# Patient Record
Sex: Female | Born: 1985 | Race: Black or African American | Hispanic: No | Marital: Single | Smoking: Current some day smoker
Health system: Southern US, Community
[De-identification: ages and names within clinical notes are randomized; demographics above are authoritative.]

## PROBLEM LIST (undated history)

## (undated) ENCOUNTER — Emergency Department (HOSPITAL_COMMUNITY): Admission: EM | Payer: Medicaid Other | Source: Home / Self Care

## (undated) DIAGNOSIS — J40 Bronchitis, not specified as acute or chronic: Secondary | ICD-10-CM

## (undated) DIAGNOSIS — F419 Anxiety disorder, unspecified: Secondary | ICD-10-CM

## (undated) DIAGNOSIS — D649 Anemia, unspecified: Secondary | ICD-10-CM

## (undated) HISTORY — PX: NO PAST SURGERIES: SHX2092

---

## 2007-07-31 ENCOUNTER — Emergency Department (HOSPITAL_COMMUNITY): Admission: EM | Admit: 2007-07-31 | Discharge: 2007-07-31 | Payer: Self-pay | Admitting: Emergency Medicine

## 2009-03-26 ENCOUNTER — Inpatient Hospital Stay (HOSPITAL_COMMUNITY): Admission: AD | Admit: 2009-03-26 | Discharge: 2009-03-28 | Payer: Self-pay | Admitting: Obstetrics and Gynecology

## 2010-09-24 LAB — CBC
HCT: 28.6 % — ABNORMAL LOW (ref 36.0–46.0)
HCT: 37.3 % (ref 36.0–46.0)
Hemoglobin: 12.1 g/dL (ref 12.0–15.0)
MCHC: 32.5 g/dL (ref 30.0–36.0)
MCHC: 33.5 g/dL (ref 30.0–36.0)
MCV: 82.5 fL (ref 78.0–100.0)
MCV: 82.7 fL (ref 78.0–100.0)
Platelets: 119 10*3/uL — ABNORMAL LOW (ref 150–400)
RBC: 4.52 MIL/uL (ref 3.87–5.11)
RDW: 14.6 % (ref 11.5–15.5)

## 2010-09-24 LAB — RPR: RPR Ser Ql: NONREACTIVE

## 2012-05-02 ENCOUNTER — Encounter (HOSPITAL_COMMUNITY): Payer: Self-pay | Admitting: *Deleted

## 2012-05-02 ENCOUNTER — Inpatient Hospital Stay (HOSPITAL_COMMUNITY)
Admission: AD | Admit: 2012-05-02 | Discharge: 2012-05-02 | Disposition: A | Discharge status: Home / Self Care | Payer: Medicaid Other | Source: Ambulatory Visit | Attending: Obstetrics & Gynecology | Admitting: Obstetrics & Gynecology

## 2012-05-02 ENCOUNTER — Inpatient Hospital Stay (HOSPITAL_COMMUNITY): Payer: Medicaid Other

## 2012-05-02 DIAGNOSIS — B349 Viral infection, unspecified: Secondary | ICD-10-CM

## 2012-05-02 DIAGNOSIS — B9789 Other viral agents as the cause of diseases classified elsewhere: Secondary | ICD-10-CM

## 2012-05-02 DIAGNOSIS — Z3490 Encounter for supervision of normal pregnancy, unspecified, unspecified trimester: Secondary | ICD-10-CM

## 2012-05-02 DIAGNOSIS — R109 Unspecified abdominal pain: Secondary | ICD-10-CM | POA: Insufficient documentation

## 2012-05-02 DIAGNOSIS — Z3480 Encounter for supervision of other normal pregnancy, unspecified trimester: Secondary | ICD-10-CM

## 2012-05-02 DIAGNOSIS — M79609 Pain in unspecified limb: Secondary | ICD-10-CM | POA: Insufficient documentation

## 2012-05-02 HISTORY — DX: Bronchitis, not specified as acute or chronic: J40

## 2012-05-02 HISTORY — DX: Anemia, unspecified: D64.9

## 2012-05-02 LAB — URINALYSIS, ROUTINE W REFLEX MICROSCOPIC
Glucose, UA: NEGATIVE mg/dL
Hgb urine dipstick: NEGATIVE
Ketones, ur: NEGATIVE mg/dL
Protein, ur: NEGATIVE mg/dL

## 2012-05-02 LAB — COMPREHENSIVE METABOLIC PANEL WITH GFR
ALT: 6 U/L (ref 0–35)
AST: 13 U/L (ref 0–37)
Albumin: 2.9 g/dL — ABNORMAL LOW (ref 3.5–5.2)
Alkaline Phosphatase: 108 U/L (ref 39–117)
BUN: 5 mg/dL — ABNORMAL LOW (ref 6–23)
CO2: 21 meq/L (ref 19–32)
Calcium: 8.6 mg/dL (ref 8.4–10.5)
Chloride: 97 meq/L (ref 96–112)
Creatinine, Ser: 0.52 mg/dL (ref 0.50–1.10)
GFR calc Af Amer: >90 mL/min (ref >90)
GFR calc non Af Amer: >90 mL/min (ref >90)
Glucose, Bld: 76 mg/dL (ref 70–99)
Potassium: 3.4 meq/L — ABNORMAL LOW (ref 3.5–5.1)
Sodium: 130 meq/L — ABNORMAL LOW (ref 135–145)
Total Bilirubin: 0.3 mg/dL (ref 0.3–1.2)
Total Protein: 6.9 g/dL (ref 6.0–8.3)

## 2012-05-02 LAB — URINE MICROSCOPIC-ADD ON

## 2012-05-02 LAB — CBC WITH DIFFERENTIAL/PLATELET
Basophils Absolute: 0 K/uL (ref 0.0–0.1)
Basophils Relative: 0 % (ref 0–1)
Eosinophils Absolute: 0.1 K/uL (ref 0.0–0.7)
Eosinophils Relative: 1 % (ref 0–5)
HCT: 32.7 % — ABNORMAL LOW (ref 36.0–46.0)
Hemoglobin: 10.5 g/dL — ABNORMAL LOW (ref 12.0–15.0)
Lymphocytes Relative: 7 % — ABNORMAL LOW (ref 12–46)
Lymphs Abs: 0.8 K/uL (ref 0.7–4.0)
MCH: 26.4 pg (ref 26.0–34.0)
MCHC: 32.1 g/dL (ref 30.0–36.0)
MCV: 82.2 fL (ref 78.0–100.0)
Monocytes Absolute: 0.9 K/uL (ref 0.1–1.0)
Monocytes Relative: 8 % (ref 3–12)
Neutro Abs: 8.6 K/uL — ABNORMAL HIGH (ref 1.7–7.7)
Neutrophils Relative %: 84 % — ABNORMAL HIGH (ref 43–77)
Platelets: 153 K/uL (ref 150–400)
RBC: 3.98 MIL/uL (ref 3.87–5.11)
RDW: 14.2 % (ref 11.5–15.5)
WBC: 10.2 K/uL (ref 4.0–10.5)

## 2012-05-02 MED ORDER — ACETAMINOPHEN 500 MG PO TABS
1000.0000 mg | ORAL_TABLET | Freq: Once | ORAL | Status: AC
  Administered 2012-05-02: 1000 mg via ORAL
  Filled 2012-05-02: qty 2

## 2012-05-02 MED ORDER — ACETAMINOPHEN 500 MG PO TABS: 1000.0000 mg | ORAL_TABLET | Freq: Four times a day (QID) | ORAL | Status: DC | PRN

## 2012-05-02 MED ORDER — LACTATED RINGERS IV BOLUS (SEPSIS)
1000.0000 mL | Freq: Once | INTRAVENOUS | Status: AC
  Administered 2012-05-02: 1000 mL via INTRAVENOUS

## 2012-05-02 NOTE — MAU Provider Note (Signed)
History     CSN: [id number]  Arrival date and time: 05/02/12 1258   First Provider Initiated Contact with Patient 05/02/12 1355      Chief Complaint  Patient presents with  . Fatigue  . Back Pain  . Leg Pain  . Abdominal Cramping   HPI  Patient is a G3P1011 @ [redacted]w[redacted]d, presenting to MAU for dizziness, fatigue, and shivering.  She states that she woke up yesterday morning and felt dizzy, cold, and tired.  Symptoms continued and gradually worsened as the day progressed, but then resolved somewhat by the evening.  When she awoke this morning, she attempted to go to work, but symptoms worsened again, prompting her to seek care.  Patient reports no recent illnesses or sick contacts.  She states that she has been taking PNV and iron supplement for anemia diagnosed at her OB office.  Patient states that she receives prenatal care in [place].  Past Medical History  Diagnosis Date  . Bronchitis   . Anemia     Past Surgical History  Procedure Date  . No past surgeries     Family History  Problem Relation Age of Onset  . Cancer Mother   . Cancer Maternal Aunt   . Diabetes Maternal Aunt   . Cancer Maternal Grandfather     History  Substance Use Topics  . Smoking status: Current Some Day Smoker  . Smokeless tobacco: Not on file  . Alcohol Use: No    Allergies: No Known Allergies  Prescriptions prior to admission  Medication Sig Dispense Refill  . ferrous sulfate 325 (65 FE) MG tablet Take 325 mg by mouth daily with breakfast.      . Prenatal Vit-Fe Fumarate-FA (PRENATAL MULTIVITAMIN) TABS Take 1 tablet by mouth daily.        Review of Systems  Constitutional: Positive for chills and malaise/fatigue. Negative for fever.  Gastrointestinal: Negative for nausea, vomiting and abdominal pain.  Musculoskeletal: Positive for back pain.  Neurological: Positive for headaches.   Physical Exam   Blood pressure 131/53, pulse 119, temperature 99 F (37.2 C), temperature source Oral,  resp. rate 18, height 5\' 3"  (1.6 m), weight 91.627 kg (202 lb).  Physical Exam  Constitutional: She is oriented to person, place, and time. She appears well-developed and well-nourished. No distress.  HENT:  Head: Normocephalic and atraumatic.  Cardiovascular: Regular rhythm and normal heart sounds.  Exam reveals no gallop and no friction rub.   No murmur heard.      Tachycardia  Respiratory: Effort normal and breath sounds normal. She has no wheezes. She has no rales.  GI: There is no tenderness.       Gravid, toco monitor in place Good fetal movement  Musculoskeletal: She exhibits no edema.  Neurological: She is alert and oriented to person, place, and time.  Skin: Skin is warm and dry. She is not diaphoretic.  Psychiatric: She has a normal mood and affect. Her behavior is normal. Judgment and thought content normal.    MAU Course  Procedures  Results for orders placed during the hospital encounter of 05/02/12 (from the past 24 hour(s))  URINALYSIS, ROUTINE W REFLEX MICROSCOPIC     Status: Abnormal   Collection Time   05/02/12  1:20 PM      Component Value Range   Color, Urine YELLOW  YELLOW   APPearance CLOUDY (*) CLEAR   Specific Gravity, Urine 1.020  1.005 - 1.030   pH 7.0  5.0 - 8.0  Glucose, UA NEGATIVE  NEGATIVE mg/dL   Hgb urine dipstick NEGATIVE  NEGATIVE   Bilirubin Urine NEGATIVE  NEGATIVE   Ketones, ur NEGATIVE  NEGATIVE mg/dL   Protein, ur NEGATIVE  NEGATIVE mg/dL   Urobilinogen, UA 0.2  0.0 - 1.0 mg/dL   Nitrite NEGATIVE  NEGATIVE   Leukocytes, UA MODERATE (*) NEGATIVE  URINE MICROSCOPIC-ADD ON     Status: Abnormal   Collection Time   05/02/12  1:20 PM      Component Value Range   Squamous Epithelial / LPF FEW (*) RARE   WBC, UA 21-50  <3 WBC/hpf   RBC / HPF 3-6  <3 RBC/hpf   Bacteria, UA MANY (*) RARE   Urine-Other MUCOUS PRESENT    CBC WITH DIFFERENTIAL     Status: Abnormal   Collection Time   05/02/12  2:20 PM      Component Value Range   WBC  10.2  4.0 - 10.5 K/uL   RBC 3.98  3.87 - 5.11 MIL/uL   Hemoglobin 10.5 (*) 12.0 - 15.0 g/dL   HCT 19.1 (*) 47.8 - 29.5 %   MCV 82.2  78.0 - 100.0 fL   MCH 26.4  26.0 - 34.0 pg   MCHC 32.1  30.0 - 36.0 g/dL   RDW 62.1  30.8 - 65.7 %   Platelets 153  150 - 400 K/uL   Neutrophils Relative 84 (*) 43 - 77 %   Neutro Abs 8.6 (*) 1.7 - 7.7 K/uL   Lymphocytes Relative 7 (*) 12 - 46 %   Lymphs Abs 0.8  0.7 - 4.0 K/uL   Monocytes Relative 8  3 - 12 %   Monocytes Absolute 0.9  0.1 - 1.0 K/uL   Eosinophils Relative 1  0 - 5 %   Eosinophils Absolute 0.1  0.0 - 0.7 K/uL   Basophils Relative 0  0 - 1 %   Basophils Absolute 0.0  0.0 - 0.1 K/uL  COMPREHENSIVE METABOLIC PANEL     Status: Abnormal   Collection Time   05/02/12  2:20 PM      Component Value Range   Sodium 130 (*) 135 - 145 mEq/L   Potassium 3.4 (*) 3.5 - 5.1 mEq/L   Chloride 97  96 - 112 mEq/L   CO2 21  19 - 32 mEq/L   Glucose, Bld 76  70 - 99 mg/dL   BUN 5 (*) 6 - 23 mg/dL   Creatinine, Ser 8.46  0.50 - 1.10 mg/dL   Calcium 8.6  8.4 - 96.2 mg/dL   Total Protein 6.9  6.0 - 8.3 g/dL   Albumin 2.9 (*) 3.5 - 5.2 g/dL   AST 13  0 - 37 U/L   ALT 6  0 - 35 U/L   Alkaline Phosphatase 108  39 - 117 U/L   Total Bilirubin 0.3  0.3 - 1.2 mg/dL   GFR calc non Af Amer >90  >90 mL/min   GFR calc Af Amer >90  >90 mL/min    FHT: baseline 180, elevated to 210, reactive strip with moderate variability, no decels. No regular contraction pattern, mild uterine irritability  MDM  14:15 1) Obtain CBC w/diff to evaluate WBC and anemia 2) Obtain CMP to evaluate electrolytes 3) Monitor FHT to evaluate fetal tachycardia 4) 1 L bolus of LR for fluid resuscitation and tachycardia  15:15 1) Obtain UDS and BPP as fetal tachycardia and maternal tachycardia have not resolved after administration of LR  16:27  1) Patient's temperature has risen to 101 F.  Order and administer 1000mg  tablet of acetaminophen.  Assessment and Plan   26 yo G3P1011 @  [redacted]w[redacted]d presenting with fatigue, shivering, and tachycardia  1) Patient's rise in temperature to 101 suggests acute infection.  Administer 1000mg  of acetaminphen to treat fever and continue to monitor and assess to determine source/type of infection and treat accordingly.   Nira Conn 05/02/2012, 2:10 PM   Results for orders placed during the hospital encounter of 05/02/12 (from the past 24 hour(s))  URINALYSIS, ROUTINE W REFLEX MICROSCOPIC     Status: Abnormal   Collection Time   05/02/12  1:20 PM      Component Value Range   Color, Urine YELLOW  YELLOW   APPearance CLOUDY (*) CLEAR   Specific Gravity, Urine 1.020  1.005 - 1.030   pH 7.0  5.0 - 8.0   Glucose, UA NEGATIVE  NEGATIVE mg/dL   Hgb urine dipstick NEGATIVE  NEGATIVE   Bilirubin Urine NEGATIVE  NEGATIVE   Ketones, ur NEGATIVE  NEGATIVE mg/dL   Protein, ur NEGATIVE  NEGATIVE mg/dL   Urobilinogen, UA 0.2  0.0 - 1.0 mg/dL   Nitrite NEGATIVE  NEGATIVE   Leukocytes, UA MODERATE (*) NEGATIVE  URINE MICROSCOPIC-ADD ON     Status: Abnormal   Collection Time   05/02/12  1:20 PM      Component Value Range   Squamous Epithelial / LPF FEW (*) RARE   WBC, UA 21-50  <3 WBC/hpf   RBC / HPF 3-6  <3 RBC/hpf   Bacteria, UA MANY (*) RARE   Urine-Other MUCOUS PRESENT    CBC WITH DIFFERENTIAL     Status: Abnormal   Collection Time   05/02/12  2:20 PM      Component Value Range   WBC 10.2  4.0 - 10.5 K/uL   RBC 3.98  3.87 - 5.11 MIL/uL   Hemoglobin 10.5 (*) 12.0 - 15.0 g/dL   HCT 16.1 (*) 09.6 - 04.5 %   MCV 82.2  78.0 - 100.0 fL   MCH 26.4  26.0 - 34.0 pg   MCHC 32.1  30.0 - 36.0 g/dL   RDW 40.9  81.1 - 91.4 %   Platelets 153  150 - 400 K/uL   Neutrophils Relative 84 (*) 43 - 77 %   Neutro Abs 8.6 (*) 1.7 - 7.7 K/uL   Lymphocytes Relative 7 (*) 12 - 46 %   Lymphs Abs 0.8  0.7 - 4.0 K/uL   Monocytes Relative 8  3 - 12 %   Monocytes Absolute 0.9  0.1 - 1.0 K/uL   Eosinophils Relative 1  0 - 5 %   Eosinophils Absolute 0.1   0.0 - 0.7 K/uL   Basophils Relative 0  0 - 1 %   Basophils Absolute 0.0  0.0 - 0.1 K/uL  COMPREHENSIVE METABOLIC PANEL     Status: Abnormal   Collection Time   05/02/12  2:20 PM      Component Value Range   Sodium 130 (*) 135 - 145 mEq/L   Potassium 3.4 (*) 3.5 - 5.1 mEq/L   Chloride 97  96 - 112 mEq/L   CO2 21  19 - 32 mEq/L   Glucose, Bld 76  70 - 99 mg/dL   BUN 5 (*) 6 - 23 mg/dL   Creatinine, Ser 7.82  0.50 - 1.10 mg/dL   Calcium 8.6  8.4 - 95.6 mg/dL   Total Protein 6.9  6.0 - 8.3 g/dL   Albumin 2.9 (*) 3.5 - 5.2 g/dL   AST 13  0 - 37 U/L   ALT 6  0 - 35 U/L   Alkaline Phosphatase 108  39 - 117 U/L   Total Bilirubin 0.3  0.3 - 1.2 mg/dL   GFR calc non Af Amer >90  >90 mL/min   GFR calc Af Amer >90  >90 mL/min       . [COMPLETED] acetaminophen  1,000 mg Oral Once  . [COMPLETED] lactated ringers  1,000 mL Intravenous Once   Fever and fetal/maternal tachycardia resolved with IV hydration and tylenol, FHR 150s, mod variability, + accels, reactive prior to D/C BPP 8/8  A/P:  1. Viral syndrome   2. Supervision of normal pregnancy       Medication List     As of 05/02/2012  6:53 PM    START taking these medications         acetaminophen 500 MG tablet   Commonly known as: TYLENOL   Take 2 tablets (1,000 mg total) by mouth every 6 (six) hours as needed for pain.      CONTINUE taking these medications         ferrous sulfate 325 (65 FE) MG tablet      prenatal multivitamin Tabs          Where to get your medications    These are the prescriptions that you need to pick up. We sent them to a specific pharmacy, so you will need to go there to get them.   [id number] [place], [place] - [address]    [address] [place] [address]    Phone: [phone]        acetaminophen 500 MG tablet            Follow-up Information    Follow up with your prenatal care provider. (this week for follow up)

## 2012-05-02 NOTE — MAU Note (Signed)
C/o back pain with leg pain; ocassional cramping; c/o feeling weak and shaky; all symptoms started yesterday morning; T-99.0;

## 2012-05-02 NOTE — MAU Provider Note (Signed)
Attestation of Attending Supervision of Advanced Practitioner (CNM/NP): Evaluation and management procedures were performed by the Advanced Practitioner under my supervision and collaboration.  I have reviewed the Advanced Practitioner's note and chart, and I agree with the management and plan.  Jenniffer Vessels, MD, FACOG Attending Obstetrician & Gynecologist Faculty Practice, [facility]  

## 2012-05-04 LAB — URINE CULTURE

## 2013-03-07 ENCOUNTER — Encounter (HOSPITAL_COMMUNITY): Payer: Self-pay | Admitting: *Deleted

## 2014-04-22 ENCOUNTER — Encounter (HOSPITAL_COMMUNITY): Payer: Self-pay | Admitting: *Deleted

## 2015-05-04 ENCOUNTER — Ambulatory Visit (INDEPENDENT_AMBULATORY_CARE_PROVIDER_SITE_OTHER): Admitting: Emergency Medicine

## 2015-05-04 VITALS — BP 112/72 | HR 67 | Temp 98.6°F | Resp 16

## 2015-05-04 DIAGNOSIS — S40012A Contusion of left shoulder, initial encounter: Secondary | ICD-10-CM | POA: Diagnosis not present

## 2015-05-04 DIAGNOSIS — Z23 Encounter for immunization: Secondary | ICD-10-CM

## 2015-05-04 DIAGNOSIS — S4992XA Unspecified injury of left shoulder and upper arm, initial encounter: Secondary | ICD-10-CM

## 2015-05-04 DIAGNOSIS — S0990XA Unspecified injury of head, initial encounter: Secondary | ICD-10-CM | POA: Diagnosis not present

## 2015-05-04 DIAGNOSIS — S0990XD Unspecified injury of head, subsequent encounter: Secondary | ICD-10-CM

## 2015-05-06 ENCOUNTER — Ambulatory Visit: Payer: Worker's Compensation

## 2015-05-06 DIAGNOSIS — S4992XA Unspecified injury of left shoulder and upper arm, initial encounter: Secondary | ICD-10-CM

## 2015-05-06 DIAGNOSIS — S0990XD Unspecified injury of head, subsequent encounter: Secondary | ICD-10-CM

## 2015-05-06 NOTE — Progress Notes (Signed)
Chief Complaint: No chief complaint on file.   HPI: Molly Butler is a 29 y.o. female who is here for an injury which occurred at work. Patient was working at [employer]. She was pulling a cart and a door on the cart opened and struck her on the left side of the head and left shoulder. She presents with a headache and left shoulder pain. 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  Past Medical History  Diagnosis Date  . Bronchitis   . Anemia    Past Surgical History  Procedure Laterality Date  . No past surgeries     Social History   Social History  . Marital Status: Single    Spouse Name: N/A  . Number of Children: N/A  . Years of Education: N/A   Social History Main Topics  . Smoking status: Current Some Day Smoker  . Smokeless tobacco: Not on file  . Alcohol Use: No  . Drug Use: No  . Sexual Activity: Not on file   Other Topics Concern  . Not on file   Social History Narrative   Family History  Problem Relation Age of Onset  . Cancer Mother   . Cancer Maternal Aunt   . Diabetes Maternal Aunt   . Cancer Maternal Grandfather    No Known Allergies Prior to Admission medications   Medication Sig Start Date End Date Taking? Authorizing Provider  acetaminophen (TYLENOL) 500 MG tablet Take 2 tablets (1,000 mg total) by mouth every 6 (six) hours as needed for pain. 05/02/12   Archie Patten, CNM  ferrous sulfate 325 (65 FE) MG tablet Take 325 mg by mouth daily with breakfast.    Historical Provider, MD  Prenatal Vit-Fe Fumarate-FA (PRENATAL MULTIVITAMIN) TABS Take 1 tablet by mouth daily.    Historical Provider, MD    ROS: The patient denies fevers, chills, night sweats, unintentional weight loss, chest pain, palpitations, wheezing, dyspnea on exertion, nausea, vomiting, abdominal pain, dysuria, hematuria, melena, numbness, weakness, or tingling.   All other systems have been reviewed and were otherwise negative with the exception of those mentioned in  the HPI and as above.    PHYSICAL EXAM: There were no vitals filed for this visit. There were no vitals filed for this visit. There is no weight on file to calculate BMI.   General: Alert, no acute distress HEENT:  Normocephalic, atraumatic, oropharynx patent. EOMI, PERRLA there is a 1 cm laceration over the left frontal region above the left eyebrow. Cardiovascular:  Regular rate and rhythm, no rubs murmurs or gallops.  No Carotid bruits, radial pulse intact. No pedal edema.  Respiratory: Clear to auscultation bilaterally.  No wheezes, rales, or rhonchi.  No cyanosis, no use of accessory musculature GI: No organomegaly, abdomen is soft and non-tender, positive bowel sounds.  No masses. Skin: No rashes. Neurologic: Facial musculature symmetric. Psychiatric: Patient is appropriate throughout our interaction. Lymphatic: No cervical lymphadenopathy Musculoskeletal: Gait intact. There is tenderness over the left posterior shoulder with pain on elevation of the shoulder. There is no obvious clavicle deformity or shoulder deformity.   LABS:    EKG/XRAY:   Primary read interpreted by Dr. Cleta Alberts at [facility]. Films of the left orbit reveal no fracture. Films of the left shoulder reveal no fracture.   ASSESSMENT/PLAN: 1. Shoulder injury, left, initial encounter Patient has a contusion of the shoulder will treat this with ice and rest.  2. Head injury, subsequent encounter Patient has a superficial  wound over the left orbit. There is no active bleeding. She will keep this clean with soap and water. She is told to go home today and given instructions on her head injury sheet. She was given a tetanus shot.      Gross sideeffects, risk and benefits, and alternatives of medications d/w patient. Patient is aware that all medications have potential sideeffects and we are unable to predict every sideeffect or drug-drug interaction that may occur.  @MEC @ 05/06/2015 2:36 PM

## 2015-05-07 NOTE — Addendum Note (Signed)
Addended by: Johnnette LitterARDWELL, Leza Apsey M on: 05/07/2015 08:35 AM   Modules accepted: Orders

## 2015-05-09 ENCOUNTER — Ambulatory Visit (INDEPENDENT_AMBULATORY_CARE_PROVIDER_SITE_OTHER): Admitting: Emergency Medicine

## 2015-05-09 VITALS — BP 122/72 | HR 87 | Temp 99.0°F | Resp 17 | Ht 62.5 in | Wt 177.0 lb

## 2015-05-09 DIAGNOSIS — S40012D Contusion of left shoulder, subsequent encounter: Secondary | ICD-10-CM

## 2015-05-09 NOTE — Progress Notes (Signed)
Subjective:  Patient ID: Molly Butler, female    DOB: 11-20-1985  Age: 29 y.o. MRN: [id number]  CC: Follow-up   HPI Molly Butler presents  patients come in for a recheck following an injury on the job last week. She was seen and a small superficial laceration on her left forehead. Also had a an injury to her left shoulder when a door fell on her shoulder. She says she's been working her regular duties at work but is having some difficulty lifting due to increased pain in her shoulder with lifting. Her symptoms no sequelae of a bump on her head  History [name] has a past medical history of Bronchitis and Anemia.   She has past surgical history that includes No past surgeries.   Her  family history includes Cancer in her maternal aunt, maternal grandfather, and mother; Diabetes in her maternal aunt.  She   reports that she has been smoking Cigars.  She does not have any smokeless tobacco history on file. She reports that she does not drink alcohol or use illicit drugs.  Outpatient Prescriptions Prior to Visit  Medication Sig Dispense Refill  . acetaminophen (TYLENOL) 500 MG tablet Take 2 tablets (1,000 mg total) by mouth every 6 (six) hours as needed for pain. 30 tablet 0  . ferrous sulfate 325 (65 FE) MG tablet Take 325 mg by mouth daily with breakfast.    . Prenatal Vit-Fe Fumarate-FA (PRENATAL MULTIVITAMIN) TABS Take 1 tablet by mouth daily.     No facility-administered medications prior to visit.    Social History   Social History  . Marital Status: Single    Spouse Name: N/A  . Number of Children: N/A  . Years of Education: N/A   Social History Main Topics  . Smoking status: Current Some Day Smoker    Types: Cigars  . Smokeless tobacco: None  . Alcohol Use: No  . Drug Use: No  . Sexual Activity: Not Asked   Other Topics Concern  . None   Social History Narrative     Review of Systems  Constitutional: Negative for fever, chills and appetite change.    HENT: Negative for congestion, ear pain, postnasal drip, sinus pressure and sore throat.   Eyes: Negative for pain and redness.  Respiratory: Negative for cough, shortness of breath and wheezing.   Cardiovascular: Negative for leg swelling.  Gastrointestinal: Negative for nausea, vomiting, abdominal pain, diarrhea, constipation and blood in stool.  Endocrine: Negative for polyuria.  Genitourinary: Negative for dysuria, urgency, frequency and flank pain.  Musculoskeletal: Negative for gait problem.  Skin: Negative for rash.  Neurological: Negative for weakness and headaches.  Psychiatric/Behavioral: Negative for confusion and decreased concentration. The patient is not nervous/anxious.     Objective:  BP 122/72 mmHg  Pulse 87  Temp(Src) 99 F (37.2 C) (Oral)  Resp 17  Ht 5' 2.5" (1.588 m)  Wt 177 lb (80.287 kg)  BMI 31.84 kg/m2  SpO2 99%  LMP 04/21/2015  Breastfeeding? No  Physical Exam  Constitutional: She is oriented to person, place, and time. She appears well-developed and well-nourished.  HENT:  Head: Normocephalic and atraumatic.  Eyes: Conjunctivae are normal. Pupils are equal, round, and reactive to light.  Pulmonary/Chest: Effort normal.  Musculoskeletal: She exhibits no edema.  Neurological: She is alert and oriented to person, place, and time.  Skin: Skin is dry.  Psychiatric: She has a normal mood and affect. Her behavior is normal. Thought content normal.   Her left shoulder  was tender in the trapezius muscles she has full active and passive range of motion shoulder. Normal strength and neurologic function   Assessment & Plan:   Molly SparVictoria was seen today for follow-up.  Diagnoses and all orders for this visit:  Shoulder contusion, left, subsequent encounter   I am having [name] maintain her ferrous sulfate, prenatal multivitamin, and acetaminophen.  No orders of the defined types were placed in this encounter.   She was put on light duty for a week  follow-up on 11/25   Appropriate red flag conditions were discussed with the patient as well as actions that should be taken.  Patient expressed his understanding.  Follow-up: Return if symptoms worsen or fail to improve.  Carmelina DaneAnderson, Mckinnon Glick S, MD

## 2015-05-09 NOTE — Patient Instructions (Signed)

## 2015-05-16 ENCOUNTER — Ambulatory Visit (INDEPENDENT_AMBULATORY_CARE_PROVIDER_SITE_OTHER): Admitting: Family Medicine

## 2015-05-16 VITALS — BP 122/70 | HR 87 | Temp 98.0°F | Resp 16 | Ht 63.0 in | Wt 180.0 lb

## 2015-05-16 DIAGNOSIS — S40012D Contusion of left shoulder, subsequent encounter: Secondary | ICD-10-CM

## 2015-05-16 DIAGNOSIS — S0990XD Unspecified injury of head, subsequent encounter: Secondary | ICD-10-CM

## 2015-05-16 NOTE — Progress Notes (Signed)
[facility] [address], [place] [address] [phone]  Date:  05/16/2015   Name:  Molly Butler   DOB:  03/09/1986   MRN:  [id number]  PCP:  No primary care provider on file.    Chief Complaint: Follow-up   History of Present Illness:  This is a 29 y.o. female who is presenting for follow up worker's comp injury that occurred on 05/04/15. She works for [employer]. She was pulling a cart when a door on the cart opened and struck her on left side of head and left shoulder. Radiograph of left orbit and left shoulder revealed no fracture. She was dx'd with head injury and shoulder contusion. She was given a tetanus shot. She was placed on light duty. She returned for f/u on 11/18 - symptoms had improved but not yet resolved. She was kept on light duty until f/u today. Today she is reporting resolution of symptoms. No longer with any shoulder pain. She can move shoulder fully without problems. Forehead where she was hit there is a mildly tender nodule but otherwise asymptomatic. She denies headaches, vision changes, paresthesias, weakness. She is ready to go back to work full duty.  Review of Systems:  Review of Systems See HPI  Prior to Admission medications   Not on File    No Known Allergies  Medication list has been reviewed and updated.  Physical Examination:  Physical Exam  Constitutional: She is oriented to person, place, and time. She appears well-developed and well-nourished. No distress.  HENT:  Head: Normocephalic and atraumatic.  Right Ear: Hearing normal.  Left Ear: Hearing normal.  Nose: Nose normal.  Small <1 cm mildly tender nodule over left lateral brow  Eyes: Conjunctivae, EOM and lids are normal. Pupils are equal, round, and reactive to light. Right eye exhibits no discharge. Left eye exhibits no discharge. No scleral icterus.  Pulmonary/Chest: Effort normal. No respiratory distress.  Musculoskeletal: Normal range of motion.      Right shoulder: Normal.       Left shoulder: Normal.  FROM left shoulder, not tender  Neurological: She is alert and oriented to person, place, and time. She has normal strength. No sensory deficit.  Skin: Skin is warm, dry and intact.  Psychiatric: She has a normal mood and affect. Her speech is normal and behavior is normal. Thought content normal.    BP 122/70 mmHg  Pulse 87  Temp(Src) 98 F (36.7 C) (Oral)  Resp 16  Ht 5\' 3"  (1.6 m)  Wt 180 lb (81.647 kg)  BMI 31.89 kg/m2  SpO2 98%  LMP 04/21/2015  Assessment and Plan:  1. Shoulder contusion, left, subsequent encounter 2. Head injury, subsequent encounter Shoulder pain completely resolved. Only symptom left from head injury is small nodule on forehead that is mildly tender to touch. Neuro exam normal. MSK exam normal. Released to full work duties. Return as needed.   Roswell MinersNicole V. Dyke BrackettBush, PA-C, MHS [facility]  05/16/2015

## 2015-10-21 ENCOUNTER — Encounter (HOSPITAL_COMMUNITY): Payer: Self-pay

## 2015-10-21 ENCOUNTER — Emergency Department (HOSPITAL_COMMUNITY)
Admission: EM | Admit: 2015-10-21 | Discharge: 2015-10-21 | Disposition: A | Discharge status: Home / Self Care | Payer: Medicaid Other | Attending: Emergency Medicine | Admitting: Emergency Medicine

## 2015-10-21 DIAGNOSIS — R1013 Epigastric pain: Secondary | ICD-10-CM

## 2015-10-21 DIAGNOSIS — Z862 Personal history of diseases of the blood and blood-forming organs and certain disorders involving the immune mechanism: Secondary | ICD-10-CM | POA: Insufficient documentation

## 2015-10-21 DIAGNOSIS — Z8709 Personal history of other diseases of the respiratory system: Secondary | ICD-10-CM | POA: Insufficient documentation

## 2015-10-21 DIAGNOSIS — Z3202 Encounter for pregnancy test, result negative: Secondary | ICD-10-CM | POA: Insufficient documentation

## 2015-10-21 DIAGNOSIS — F17210 Nicotine dependence, cigarettes, uncomplicated: Secondary | ICD-10-CM | POA: Insufficient documentation

## 2015-10-21 LAB — CBC
HEMATOCRIT: 37.4 % (ref 36.0–46.0)
HEMOGLOBIN: 11.8 g/dL — AB (ref 12.0–15.0)
MCH: 26 pg (ref 26.0–34.0)
MCHC: 31.6 g/dL (ref 30.0–36.0)
MCV: 82.4 fL (ref 78.0–100.0)
Platelets: 209 10*3/uL (ref 150–400)
RBC: 4.54 MIL/uL (ref 3.87–5.11)
RDW: 13 % (ref 11.5–15.5)
WBC: 5.2 10*3/uL (ref 4.0–10.5)

## 2015-10-21 LAB — COMPREHENSIVE METABOLIC PANEL
ALBUMIN: 3.6 g/dL (ref 3.5–5.0)
ALT: 10 U/L — AB (ref 14–54)
AST: 15 U/L (ref 15–41)
Alkaline Phosphatase: 39 U/L (ref 38–126)
Anion gap: 7 (ref 5–15)
BUN: 12 mg/dL (ref 6–20)
CHLORIDE: 107 mmol/L (ref 101–111)
CO2: 23 mmol/L (ref 22–32)
CREATININE: 0.83 mg/dL (ref 0.44–1.00)
Calcium: 8.8 mg/dL — ABNORMAL LOW (ref 8.9–10.3)
GFR calc Af Amer: >60 mL/min (ref >60)
GLUCOSE: 89 mg/dL (ref 65–99)
POTASSIUM: 3.9 mmol/L (ref 3.5–5.1)
Sodium: 137 mmol/L (ref 135–145)
Total Bilirubin: 0.4 mg/dL (ref 0.3–1.2)
Total Protein: 6.7 g/dL (ref 6.5–8.1)

## 2015-10-21 LAB — I-STAT BETA HCG BLOOD, ED (MC, WL, AP ONLY)

## 2015-10-21 LAB — LIPASE, BLOOD: LIPASE: 27 U/L (ref 11–51)

## 2015-10-21 NOTE — ED Notes (Signed)
Patient here with 2 weeks of epigastric discomfort. Reports that when she walks she feels that area tighten up. No diarrhea and 1 episode of vomiting.

## 2015-10-21 NOTE — ED Provider Notes (Signed)
History  By signing my name below, I, Karle Plumber, attest that this documentation has been prepared under the direction and in the presence of Audry Pili, PA-C. Electronically Signed: Karle Plumber, ED Scribe. 10/21/2015. 5:40 PM.  Chief Complaint  Patient presents with  . epigastric discomfort    The history is provided by the patient and medical records. No language interpreter was used.    HPI Comments:  Molly Butler is a 30 y.o. female who presents to the Emergency Department complaining of abdominal pain that began about 1.5 to two weeks ago. She rates the pain at 7/10. She reports one episode of emesis two days ago. Pt reports tightening of the abdomen while walking. Pt usually sits down and the discomfort resolves. She has taken Ibuprofen for pain.. She denies pain after eating, fever, chills, vaginal discharge, CP, current SOB or any current pain. She denies any abdominal surgeries. Pt does not have a PCP but states she goes to the health department. Pt is able to tolerate PO without difficulty. No other symptoms noted.   Past Medical History  Diagnosis Date  . Bronchitis   . Anemia    Past Surgical History  Procedure Laterality Date  . No past surgeries     Family History  Problem Relation Age of Onset  . Cancer Mother   . Cancer Maternal Aunt   . Diabetes Maternal Aunt   . Cancer Maternal Grandfather    Social History  Substance Use Topics  . Smoking status: Current Some Day Smoker    Types: Cigars  . Smokeless tobacco: Never Used  . Alcohol Use: No   OB History    Gravida Para Term Preterm AB TAB SAB Ectopic Multiple Living   Review of Systems A complete 10 system review of systems was obtained and all systems are negative except as noted in the HPI and PMH.   Allergies  Review of patient's allergies indicates no known allergies.  Home Medications   Prior to Admission medications   Not on File   Triage Vitals: BP 121/70  mmHg  Pulse 73  Temp(Src) 98.4 F (36.9 C) (Oral)  Resp 18  Ht  (1.6 m)  Wt 175 lb (79.379 kg)  BMI 31.01 kg/m2  SpO2 100%  LMP 10/19/2015  Physical Exam  Constitutional: She is oriented to person, place, and time. She appears well-developed and well-nourished.  HENT:  Head: Normocephalic and atraumatic.  Eyes: EOM are normal.  Neck: Normal range of motion.  Cardiovascular: Normal rate, regular rhythm, S1 normal, normal heart sounds, intact distal pulses and normal pulses.   No murmur heard. Pulmonary/Chest: Effort normal.  Abdominal: Soft. Normal appearance and bowel sounds are normal. There is no tenderness. There is no rigidity, no rebound, no guarding, no tenderness at McBurney's point and negative Murphy's sign. No hernia. Hernia confirmed negative in the ventral area.  Musculoskeletal: Normal range of motion.  Neurological: She is alert and oriented to person, place, and time.  Skin: Skin is warm and dry.  Psychiatric: She has a normal mood and affect. Her behavior is normal. Thought content normal.  Nursing note and vitals reviewed.  ED Course  Procedures (including critical care time) DIAGNOSTIC STUDIES: Oxygen Saturation is 100% on RA, normal by my interpretation.   COORDINATION OF CARE: 5:37 PM- Will wait for labs to result. Advised pt to follow up with [facility]. Pt verbalizes understanding  and agrees to plan.  Medications - No data to display  Labs Review Labs Reviewed  COMPREHENSIVE METABOLIC PANEL - Abnormal; Notable for the following:    Calcium 8.8 (*)    ALT 10 (*)    All other components within normal limits  CBC - Abnormal; Notable for the following:    Hemoglobin 11.8 (*)    All other components within normal limits  LIPASE, BLOOD  I-STAT BETA HCG BLOOD, ED ([facility], [facility], [facility] ONLY)   Imaging Review No results found. I have personally reviewed and evaluated these images and lab results as part of my medical decision-making.   EKG  Interpretation None      MDM  I have reviewed and evaluated the relevant laboratory values. I have reviewed the relevant previous healthcare records. I obtained HPI from historian.  ED Course:  Assessment: Patient is a 29yF presents with abdominal pain x 1.5 weeks. On exam, nontoxic, nonseptic appearing, in no apparent distress. Patient's pain and other symptoms adequately managed in emergency department. Labs, imaging and vitals reviewed.  Patient does not meet the SIRS or Sepsis criteria.  On repeat exam patient does not have a surgical abdomen and there are no peritoneal signs.  No indication of appendicitis, bowel obstruction, bowel perforation, cholecystitis, diverticulitis, PID or ectopic pregnancy. Possible hiatal hernia. No acute abdomen on exam. Symptoms present only with exertion. Relieved with rest. Patient discharged home with symptomatic treatment and given strict instructions for follow-up with GI physician if symptoms persist.  I have also discussed reasons to return immediately to the ER.  Patient expresses understanding and agrees with plan.  Disposition/Plan:  DC Home Additional Verbal discharge instructions given and discussed with patient.  Pt Instructed to f/u with GI in the next week for evaluation and treatment of symptoms. Return precautions given Pt acknowledges and agrees with plan  Supervising Physician Arby BarretteMarcy Pfeiffer, MD   Final diagnoses:  Epigastric pain   I personally performed the services described in this documentation, which was scribed in my presence. The recorded information has been reviewed and is accurate.   Audry Piliyler Lania Zawistowski, PA-C 10/21/15 1757  Arby BarretteMarcy Pfeiffer, MD 10/21/15 [id number]

## 2015-10-21 NOTE — ED Notes (Signed)
Patient alert and oriented at discharge.  Patient verbalized understanding of discharge instructions and the need for follow up care.   

## 2015-10-21 NOTE — Discharge Instructions (Signed)
Please read and follow all provided instructions.  Your diagnoses today include:  1. Epigastric pain    Tests performed today include:  Blood counts and electrolytes  Blood tests to check liver and kidney function  Blood tests to check pancreas function  Urine test to look for infection and pregnancy (in women)  Vital signs. See below for your results today.   Medications prescribed:   Take any prescribed medications only as directed.  Home care instructions:   Follow any educational materials contained in this packet.  Follow-up instructions: Please follow-up with Gastroenterology for further evaluation of your symptoms. Possible Hiatal Hernia they can evaluated with an endoscopy.    Return instructions:  SEEK IMMEDIATE MEDICAL ATTENTION IF:  The pain does not go away or becomes severe   A temperature above 101F develops   Repeated vomiting occurs (multiple episodes)   The pain becomes localized to portions of the abdomen. The right side could possibly be appendicitis. In an adult, the left lower portion of the abdomen could be colitis or diverticulitis.   Blood is being passed in stools or vomit (bright red or black tarry stools)   You develop chest pain, difficulty breathing, dizziness or fainting, or become confused, poorly responsive, or inconsolable (young children)  If you have any other emergent concerns regarding your health  Additional Information: Abdominal (belly) pain can be caused by many things. Your caregiver performed an examination and possibly ordered blood/urine tests and imaging (CT scan, x-rays, ultrasound). Many cases can be observed and treated at home after initial evaluation in the emergency department. Even though you are being discharged home, abdominal pain can be unpredictable. Therefore, you need a repeated exam if your pain does not resolve, returns, or worsens. Most patients with abdominal pain don't have to be admitted to the hospital or  have surgery, but serious problems like appendicitis and gallbladder attacks can start out as nonspecific pain. Many abdominal conditions cannot be diagnosed in one visit, so follow-up evaluations are very important.  Your vital signs today were: BP 121/70 mmHg   Pulse 73   Temp(Src) 98.4 F (36.9 C) (Oral)   Resp 18   Ht 5\' 3"  (1.6 m)   Wt 79.379 kg   BMI 31.01 kg/m2   SpO2 100%   LMP 10/19/2015 If your blood pressure (bp) was elevated above 135/85 this visit, please have this repeated by your doctor within one month. --------------

## 2016-10-22 IMAGING — CR DG ORBITS COMPLETE 4+V
5 series · 5 of 5 positions shown · non-contrast
Comparison: None.

CLINICAL DATA: Head injury

EXAM:
ORBITS - COMPLETE 4+ VIEW

[lateral]
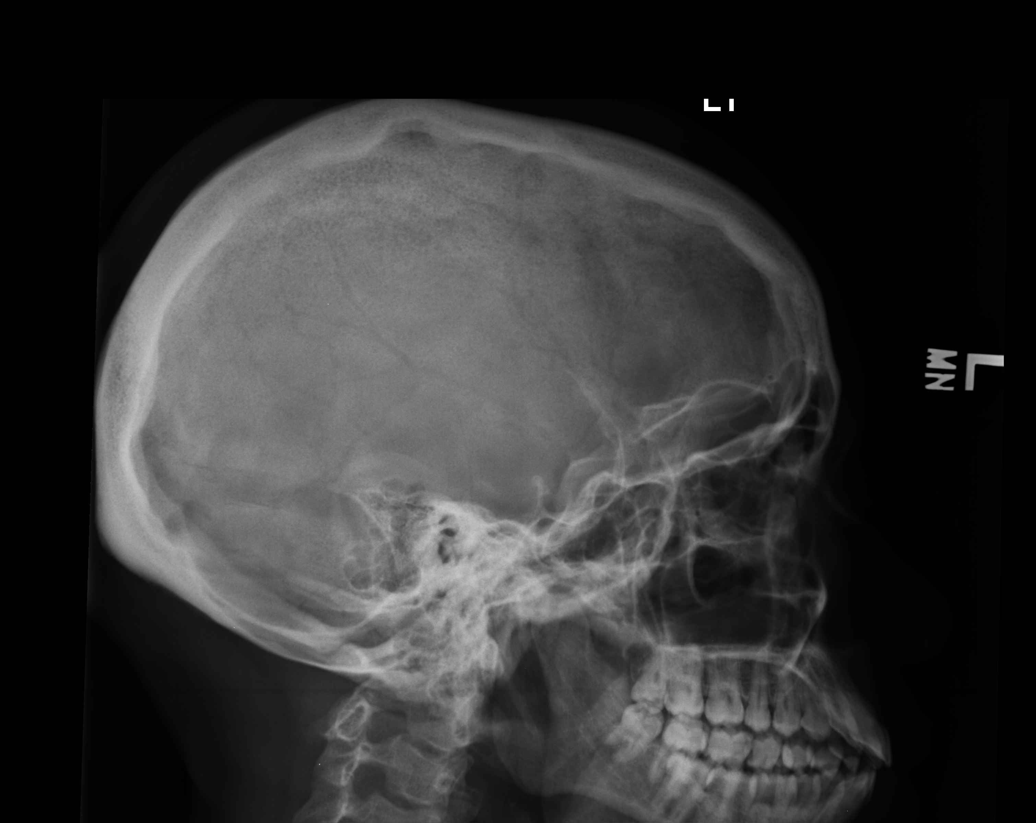

[pa waters (1 of 2)]
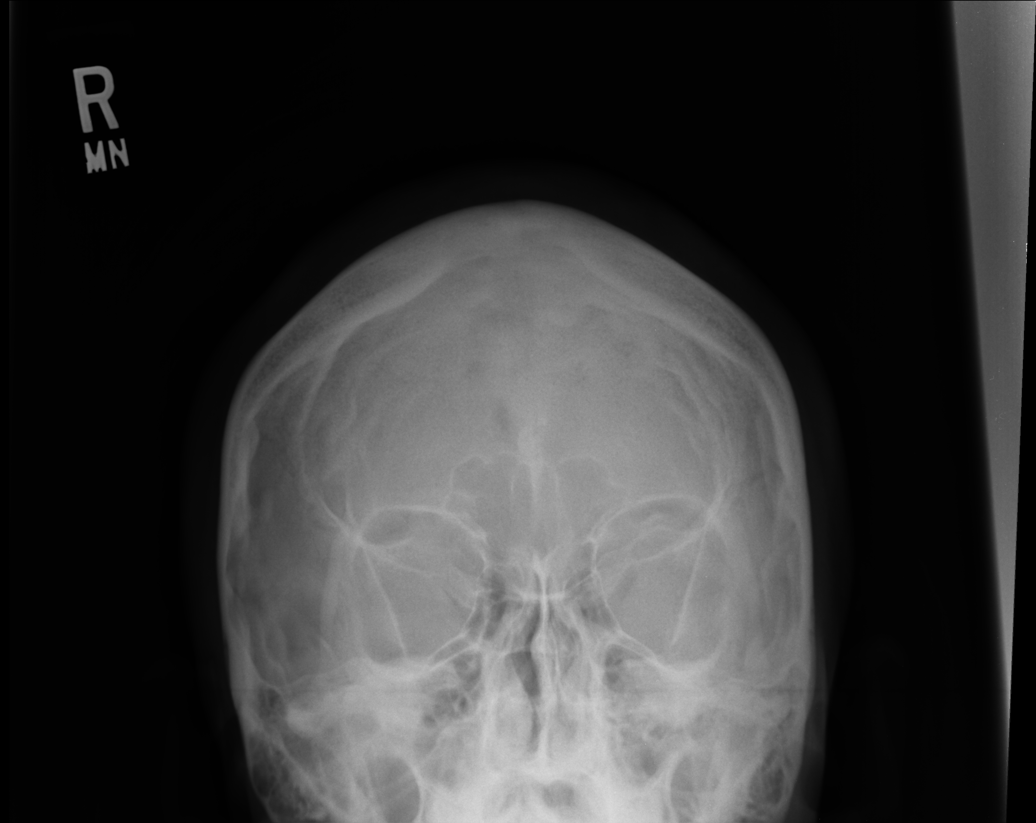

[pa waters (2 of 2)]
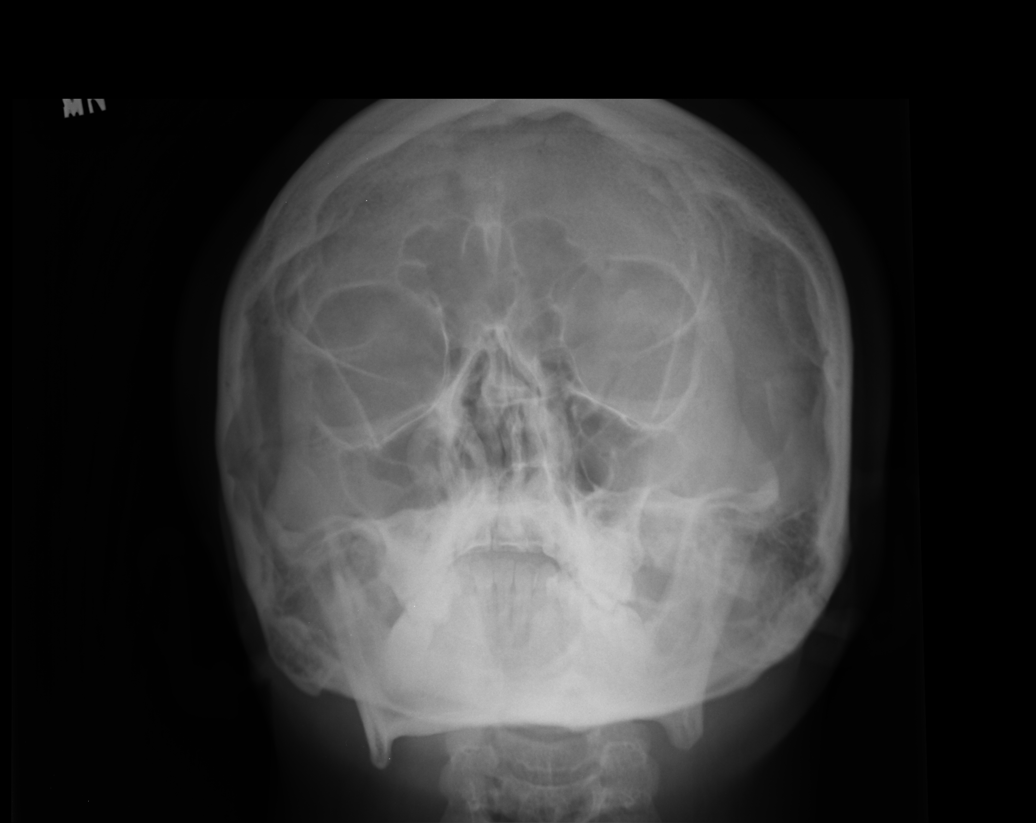

[oblique (1 of 2)]
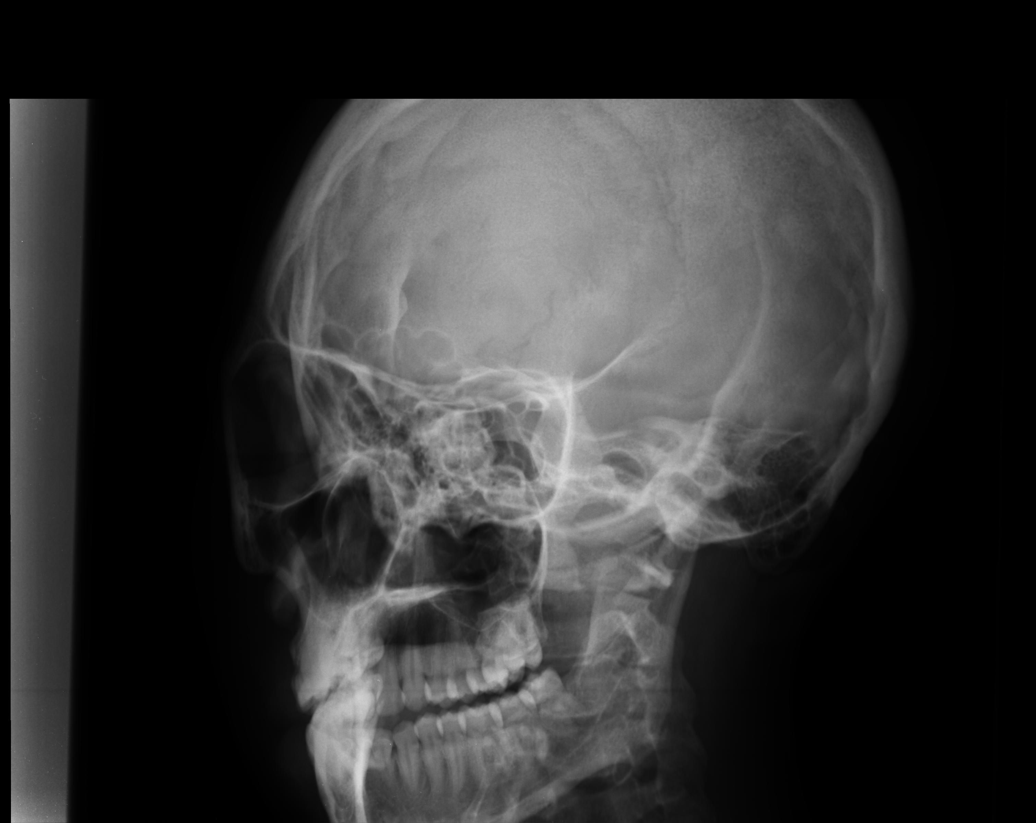

[oblique (2 of 2)]
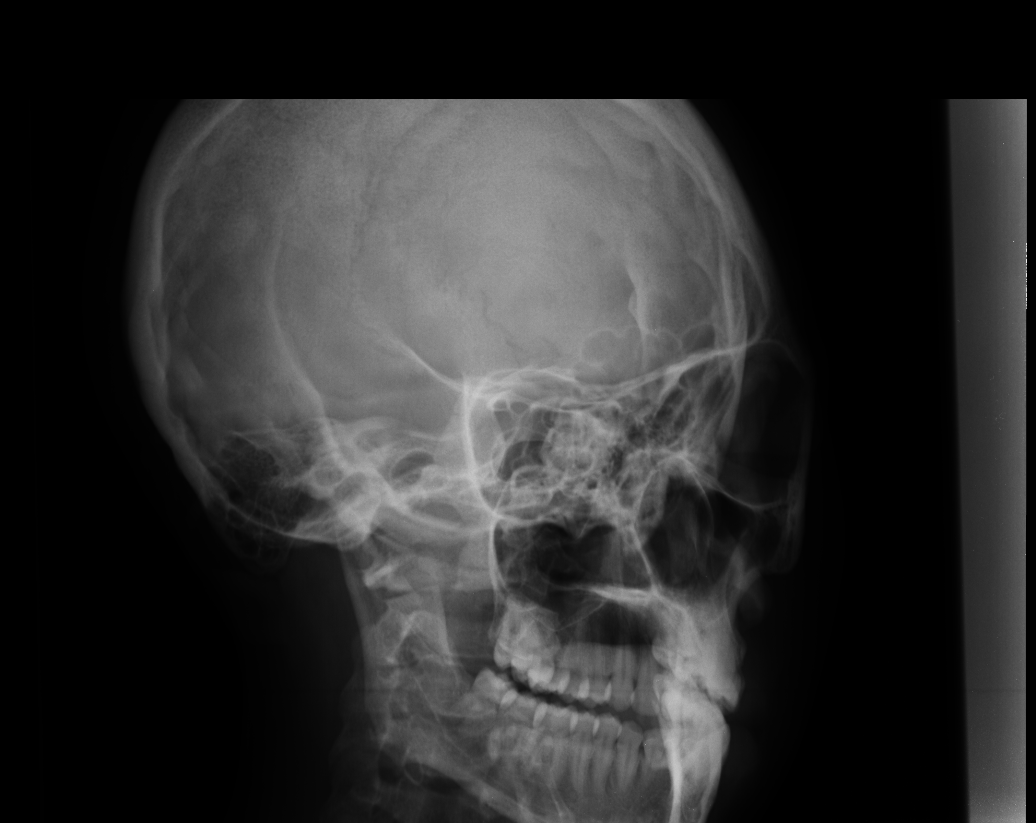

[5 of 5 positions shown; findings below may reference images not displayed]

FINDINGS: No maxillofacial fracture is seen. There is no evidence of
periorbital air. The paranasal sinuses appear pneumatized.
IMPRESSION: Negative.

## 2020-10-10 ENCOUNTER — Telehealth: Payer: Self-pay | Admitting: *Deleted

## 2020-10-10 NOTE — Telephone Encounter (Addendum)
-----   Message from Kathrynn Running, MD sent at 10/08/2020  4:53 PM EDT ----- I received a message from this patient's PCP. The PCP was unavailable and the administrator who I spoke with was unable to contact the PCP, but apparently the patient is having vaginal bleeding. I attempted to call the patient but there was no answer. Will you please reach out to the patient and triage? May need MAU eval versus scheduling her in clinic. Thanks, Anette Riedel  4/22  1242  Called pt and left message on voicemail. I stated that one of our physicians had been contacted by her doctor regarding a concern. I am following up on this concern to discuss further. If she is having problems which require immediate attention, she should got to any urgent care facility, ED or to Maternity Assessment unit @ [facility]. She may call back if she has questions.

## 2020-10-13 ENCOUNTER — Telehealth: Payer: Self-pay | Admitting: Lactation Services

## 2020-10-13 NOTE — Telephone Encounter (Signed)
Attempted to call patient. She did not answer. LM for patient to call the office at her convenience if she still has concerns for OB/GYN. Call back number given.

## 2020-10-13 NOTE — Telephone Encounter (Signed)
-----   Message from Kathrynn Running, MD sent at 10/08/2020  4:53 PM EDT ----- I received a message from this patient's PCP. The PCP was unavailable and the administrator who I spoke with was unable to contact the PCP, but apparently the patient is having vaginal bleeding. I attempted to call the patient but there was no answer. Will you please reach out to the patient and triage? May need MAU eval versus scheduling her in clinic. Thanks, [employer]

## 2020-10-15 ENCOUNTER — Other Ambulatory Visit: Payer: Self-pay | Admitting: Family Medicine

## 2020-10-15 DIAGNOSIS — N63 Unspecified lump in unspecified breast: Secondary | ICD-10-CM

## 2020-10-15 DIAGNOSIS — N644 Mastodynia: Secondary | ICD-10-CM

## 2020-11-10 ENCOUNTER — Ambulatory Visit: Payer: Self-pay

## 2020-11-10 ENCOUNTER — Other Ambulatory Visit: Payer: Self-pay

## 2020-11-10 ENCOUNTER — Ambulatory Visit
Admission: RE | Admit: 2020-11-10 | Discharge: 2020-11-10 | Disposition: A | Discharge status: Home / Self Care | Payer: Medicaid Other | Source: Ambulatory Visit | Attending: Family Medicine | Admitting: Family Medicine

## 2020-11-10 DIAGNOSIS — N63 Unspecified lump in unspecified breast: Secondary | ICD-10-CM

## 2020-11-10 DIAGNOSIS — N644 Mastodynia: Secondary | ICD-10-CM

## 2021-10-05 ENCOUNTER — Ambulatory Visit (INDEPENDENT_AMBULATORY_CARE_PROVIDER_SITE_OTHER): Payer: 59 | Admitting: Obstetrics and Gynecology

## 2021-10-05 ENCOUNTER — Encounter: Payer: Self-pay | Admitting: Obstetrics and Gynecology

## 2021-10-05 DIAGNOSIS — Z538 Procedure and treatment not carried out for other reasons: Secondary | ICD-10-CM | POA: Diagnosis not present

## 2021-10-05 DIAGNOSIS — Z975 Presence of (intrauterine) contraceptive device: Secondary | ICD-10-CM | POA: Diagnosis not present

## 2021-10-05 NOTE — Progress Notes (Signed)
Ms Richardson presents for removal of IUD. Attempted removal at [facility] unsuccessful due to pt discomfort. ?Desires replaced pending how removal goes. ? ?PE AF  VSS ?Lungs clear Heart RRR ?Abd soft + BS ?GU IUD strings noted, grasped, unable to remove due to pt discomfort. Procedure  stopped. ? ?A/P Unable to remove IUD ? ?Hysteroscopic removal with placement of new IUD reviewed with pt. Pt desires to schedule. F/U with post op appt.  ?

## 2021-10-05 NOTE — Progress Notes (Signed)
Called Legrand Como at [place], no answer, left VM to get Pts IUD insertion records from 2016 or 2017. Feb /2022 removal attempt. ?

## 2021-10-05 NOTE — Patient Instructions (Signed)
Hysteroscopy ?Hysteroscopy is a procedure used to look inside a woman's womb (uterus). This may be done for various reasons, including: ?To look for tumors and other growths in the uterus. ?To evaluate abnormal bleeding, fibroid tumors, polyps, scar tissue, or uterine cancer. ?To determine why a woman is unable to get pregnant or has had repeated pregnancy losses. ?To locate an IUD (intrauterine device). ?To place a birth control device into the fallopian tubes. ?During this procedure, a thin, flexible tube with a small light and camera (hysteroscope) is used to examine the uterus. The camera sends images to a monitor in the room so that your health care provider can view the inside of your uterus. A hysteroscopy should be done right after a menstrual period. ?Tell a health care provider about: ?Any allergies you have. ?All medicines you are taking, including vitamins, herbs, eye drops, creams, and over-the-counter medicines. ?Any problems you or family members have had with anesthetic medicines. ?Any blood disorders you have. ?Any surgeries you have had. ?Any medical conditions you have. ?Whether you are pregnant or may be pregnant. ?Whether you have been diagnosed with an STI (sexually transmitted infection) or you think you have an STI. ?What are the risks? ?Generally, this is a safe procedure. However, problems may occur, including: ?Excessive bleeding. ?Infection. ?Damage to the uterus or other structures or organs. ?Allergic reaction to medicines or fluids that are used in the procedure. ?What happens before the procedure? ?Staying hydrated ?Follow instructions from your health care provider about hydration, which may include: ?Up to 2 hours before the procedure - you may continue to drink clear liquids, such as water, clear fruit juice, black coffee, and plain tea. ?Eating and drinking restrictions ?Follow instructions from your health care provider about eating and drinking, which may include: ?8 hours  before the procedure - stop eating solid foods and drink clear liquids only. ?2 hours before the procedure - stop drinking clear liquids. ?Medicines ?Ask your health care provider about: ?Changing or stopping your regular medicines. This is especially important if you are taking diabetes medicines or blood thinners. ?Taking medicines such as aspirin and ibuprofen. These medicines can thin your blood. Do not take these medicines unless your health care provider tells you to take them. ?Taking over-the-counter medicines, vitamins, herbs, and supplements. ?Medicine may be placed in your cervix the day before the procedure. This medicine causes the cervix to open (dilate). The larger opening makes it easier for the hysteroscope to be inserted into the uterus during the procedure. ?General instructions ?Ask your health care provider: ?What steps will be taken to help prevent infection. These steps may include: ?Washing skin with a germ-killing soap. ?Taking antibiotic medicine. ?Do not use any products that contain nicotine or tobacco for at least 4 weeks before the procedure. These products include cigarettes, chewing tobacco, and vaping devices, such as e-cigarettes. If you need help quitting, ask your health care provider. ?Plan to have a responsible adult take you home from the hospital or clinic. ?Plan to have a responsible adult care for you for the time you are told after you leave the hospital or clinic. This is important. ?Empty your bladder before the procedure begins. ?What happens during the procedure? ?An IV will be inserted into one of your veins. ?You may be given: ?A medicine to help you relax (sedative). ?A medicine that numbs the area around the cervix (local anesthetic). ?A medicine to make you fall asleep (general anesthetic). ?A hysteroscope will be inserted through your vagina   and into your uterus. ?Air or fluid will be used to enlarge your uterus to allow your health care provider to see it better.  The amount of fluid used will be carefully checked throughout the procedure. ?In some cases, tissue may be gently scraped from inside the uterus and sent to a lab for testing (biopsy). ?The procedure may vary among health care providers and hospitals. ?What can I expect after the procedure? ?Your blood pressure, heart rate, breathing rate, and blood oxygen level will be monitored until you leave the hospital or clinic. ?You may have cramps. You may be given medicines for this. ?You may have bleeding, which may vary from light spotting to menstrual-like bleeding. This is normal. ?If you had a biopsy, it is up to you to get the results. Ask your health care provider, or the department that is doing the procedure, when your results will be ready. ?Follow these instructions at home: ?Activity ?Rest as told by your health care provider. ?Return to your normal activities as told by your health care provider. Ask your health care provider what activities are safe for you. ?If you were given a sedative during the procedure, it can affect you for several hours. Do not drive or operate machinery until your health care provider says that it is safe. ?Medicines ?Do not take aspirin or other NSAIDs during recovery, as told by your healthcare provider. It can increase the risk of bleeding. ?Ask your health care provider if the medicine prescribed to you: ?Requires you to avoid driving or using machinery. ?Can cause constipation. You may need to take these actions to prevent or treat constipation: ?Drink enough fluid to keep your urine pale yellow. ?Take over-the-counter or prescription medicines. ?Eat foods that are high in fiber, such as beans, whole grains, and fresh fruits and vegetables. ?Limit foods that are high in fat and processed sugars, such as fried or sweet foods. ?General instructions ?Do not douche, use tampons, or have sex for 2 weeks after the procedure, or until your health care provider approves. ?Do not take  baths, swim, or use a hot tub until your health care provider approves. Take showers instead of baths for 2 weeks, or for as long as told by your health care provider. ?Keep all follow-up visits. This is important. ?Contact a health care provider if: ?You feel dizzy or lightheaded. ?You feel nauseous. ?You have abnormal vaginal discharge. ?You have a rash. ?You have pain that does not get better with medicine. ?You have chills. ?Get help right away if: ?You have bleeding that is heavier than a normal menstrual period. ?You have a fever. ?You have pain or cramps that get worse. ?You develop new abdominal pain. ?You faint. ?You have pain in your shoulder. ?You are short of breath. ?Summary ?Hysteroscopy is a procedure that is used to look inside a woman's womb (uterus). ?After the procedure, you may have bleeding, which varies from light spotting to menstrual-like bleeding. This is normal. You may also have cramps. ?Do not douche, use tampons, or have sex for 2 weeks after the procedure, or until your health care provider approves. ?Plan to have a responsible adult take you home from the hospital or clinic. ?This information is not intended to replace advice given to you by your health care provider. Make sure you discuss any questions you have with your health care provider. ?Document Revised: 05/04/2021 Document Reviewed: 01/23/2020 ?Elsevier Patient Education ? 2023 [employer]vier Inc[employer] ? ?

## 2021-10-13 NOTE — Progress Notes (Unsigned)
Per Linus Orn at [facility]. HD Medical Records,this Pt has not had any visits there ?

## 2021-10-31 NOTE — H&P (Signed)
Molly Butler is an 36 y.o. female with retained IUD. Unsuccessful removal in office ? ? ?Menstrual History: ?Menarche age: 36 ?No LMP recorded. (Menstrual status: IUD). ?  ? ?Past Medical History:  ?Diagnosis Date  ? Anemia   ? Bronchitis   ? ? ?Past Surgical History:  ?Procedure Laterality Date  ? NO PAST SURGERIES    ? ? ?Family History  ?Problem Relation Age of Onset  ? Cancer Mother   ? Cancer Maternal Aunt   ? Diabetes Maternal Aunt   ? Cancer Maternal Grandfather   ? ? ?Social History:  reports that she has been smoking cigars. She has never used smokeless tobacco. She reports that she does not drink alcohol and does not use drugs. ? ?Allergies: No Known Allergies ? ?No medications prior to admission.  ? ? ?Review of Systems  ?Constitutional: Negative.   ?Respiratory: Negative.    ?Cardiovascular: Negative.   ?Gastrointestinal: Negative.   ?Genitourinary: Negative.   ? ?There were no vitals taken for this visit. ?Physical Exam ?Constitutional:   ?   Appearance: Normal appearance.  ?Cardiovascular:  ?   Rate and Rhythm: Normal rate and regular rhythm.  ?Pulmonary:  ?   Effort: Pulmonary effort is normal.  ?   Breath sounds: Normal breath sounds.  ?Abdominal:  ?   General: Bowel sounds are normal.  ?   Palpations: Abdomen is soft.  ?Genitourinary: ?   Comments: NL EGBUS, cervix no lesions, no IUD strings visible ? ? ?No results found for this or any previous visit (from the past 24 hour(s)). ? ?No results found. ? ?Assessment/Plan: ?Retained IUD ? ? ?Removal of IUD reviewed with pt. R/B/Post op care discussed. Pt agrees to proceed.  ? ?Chancy Milroy ?10/31/2021, 9:37 PM ? ?

## 2021-11-02 ENCOUNTER — Other Ambulatory Visit: Payer: Self-pay

## 2021-11-02 ENCOUNTER — Encounter (HOSPITAL_COMMUNITY): Payer: Self-pay | Admitting: Obstetrics and Gynecology

## 2021-11-02 MED ORDER — DOXYCYCLINE HYCLATE 100 MG IV SOLR
200.0000 mg | INTRAVENOUS | Status: AC
  Administered 2021-11-03: 200 mg via INTRAVENOUS
  Filled 2021-11-02 (×3): qty 200

## 2021-11-02 NOTE — Progress Notes (Signed)
SDW CALL ? ?Patient was given pre-op instructions over the phone. The opportunity was given for the patient to ask questions. No further questions asked. Patient verbalized understanding of instructions given. ? ? ?PCP - [facility] ?Cardiologist - denies ? ?Chest x-ray - n/a ?EKG - n/a ?Stress Test - denies  ?ECHO - denies ?Cardiac Cath - denies ? ? ?Blood Thinner Instructions: ?Aspirin Instructions: ? ?ERAS Protcol - clears until 11am ?PRE-SURGERY Ensure or G2-  ? ?COVID TEST- n/a-ambulatory ? ? ?Anesthesia review: NO ? ?Patient denies shortness of breath, fever, cough and chest pain over the phone call ? ? ?

## 2021-11-03 ENCOUNTER — Encounter (HOSPITAL_COMMUNITY)
Admission: RE | Disposition: A | Discharge status: Home / Self Care | Payer: Self-pay | Source: Home / Self Care | Attending: Obstetrics and Gynecology

## 2021-11-03 ENCOUNTER — Ambulatory Visit (HOSPITAL_COMMUNITY)
Admission: RE | Admit: 2021-11-03 | Discharge: 2021-11-03 | Disposition: A | Discharge status: Home / Self Care | Payer: 59 | Attending: Obstetrics and Gynecology | Admitting: Obstetrics and Gynecology

## 2021-11-03 ENCOUNTER — Other Ambulatory Visit: Payer: Self-pay

## 2021-11-03 ENCOUNTER — Ambulatory Visit (HOSPITAL_COMMUNITY): Payer: 59 | Admitting: Anesthesiology

## 2021-11-03 ENCOUNTER — Ambulatory Visit (HOSPITAL_BASED_OUTPATIENT_CLINIC_OR_DEPARTMENT_OTHER): Payer: 59 | Admitting: Anesthesiology

## 2021-11-03 ENCOUNTER — Encounter (HOSPITAL_COMMUNITY): Payer: Self-pay | Admitting: Obstetrics and Gynecology

## 2021-11-03 DIAGNOSIS — Z302 Encounter for sterilization: Secondary | ICD-10-CM | POA: Diagnosis present

## 2021-11-03 DIAGNOSIS — Z30433 Encounter for removal and reinsertion of intrauterine contraceptive device: Secondary | ICD-10-CM | POA: Diagnosis not present

## 2021-11-03 DIAGNOSIS — Z975 Presence of (intrauterine) contraceptive device: Secondary | ICD-10-CM | POA: Diagnosis not present

## 2021-11-03 DIAGNOSIS — F17210 Nicotine dependence, cigarettes, uncomplicated: Secondary | ICD-10-CM | POA: Insufficient documentation

## 2021-11-03 DIAGNOSIS — Z538 Procedure and treatment not carried out for other reasons: Secondary | ICD-10-CM

## 2021-11-03 HISTORY — PX: IUD REMOVAL: SHX5392

## 2021-11-03 HISTORY — DX: Anxiety disorder, unspecified: F41.9

## 2021-11-03 LAB — CBC
HCT: 40.3 % (ref 36.0–46.0)
Hemoglobin: 12.8 g/dL (ref 12.0–15.0)
MCH: 26.9 pg (ref 26.0–34.0)
MCHC: 31.8 g/dL (ref 30.0–36.0)
MCV: 84.8 fL (ref 80.0–100.0)
Platelets: 206 10*3/uL (ref 150–400)
RBC: 4.75 MIL/uL (ref 3.87–5.11)
RDW: 13.1 % (ref 11.5–15.5)
WBC: 4.5 10*3/uL (ref 4.0–10.5)
nRBC: 0 % (ref 0.0–0.2)

## 2021-11-03 LAB — POCT PREGNANCY, URINE: Preg Test, Ur: NEGATIVE

## 2021-11-03 SURGERY — REMOVAL, INTRAUTERINE DEVICE
Anesthesia: General | Site: Uterus

## 2021-11-03 MED ORDER — ACETAMINOPHEN 500 MG PO TABS
ORAL_TABLET | ORAL | Status: AC
  Administered 2021-11-03: 1000 mg via ORAL
  Filled 2021-11-03: qty 2

## 2021-11-03 MED ORDER — LEVONORGESTREL 20 MCG/DAY IU IUD
1.0000 | INTRAUTERINE_SYSTEM | INTRAUTERINE | Status: AC
  Administered 2021-11-03: 1 via INTRAUTERINE
  Filled 2021-11-03: qty 1

## 2021-11-03 MED ORDER — SOD CITRATE-CITRIC ACID 500-334 MG/5ML PO SOLN: 30.0000 mL | ORAL | Status: AC

## 2021-11-03 MED ORDER — POVIDONE-IODINE 10 % EX SWAB
2.0000 "application " | Freq: Once | CUTANEOUS | Status: AC
  Administered 2021-11-03: 2 via TOPICAL

## 2021-11-03 MED ORDER — FENTANYL CITRATE (PF) 250 MCG/5ML IJ SOLN
INTRAMUSCULAR | Status: DC | PRN
  Administered 2021-11-03: 50 ug via INTRAVENOUS

## 2021-11-03 MED ORDER — ONDANSETRON HCL 4 MG/2ML IJ SOLN
INTRAMUSCULAR | Status: DC | PRN
  Administered 2021-11-03: 4 mg via INTRAVENOUS

## 2021-11-03 MED ORDER — OXYCODONE HCL 5 MG PO TABS: 5.0000 mg | ORAL_TABLET | Freq: Once | ORAL | Status: DC | PRN

## 2021-11-03 MED ORDER — BUPIVACAINE HCL (PF) 0.5 % IJ SOLN
INTRAMUSCULAR | Status: AC
  Filled 2021-11-03: qty 60

## 2021-11-03 MED ORDER — LACTATED RINGERS IV SOLN: INTRAVENOUS | Status: DC

## 2021-11-03 MED ORDER — CHLORHEXIDINE GLUCONATE 0.12 % MT SOLN: 15.0000 mL | Freq: Once | OROMUCOSAL | Status: AC

## 2021-11-03 MED ORDER — PROPOFOL 10 MG/ML IV BOLUS
INTRAVENOUS | Status: DC | PRN
  Administered 2021-11-03: 200 mg via INTRAVENOUS

## 2021-11-03 MED ORDER — FERRIC SUBSULFATE 259 MG/GM EX SOLN
CUTANEOUS | Status: AC
  Filled 2021-11-03: qty 8

## 2021-11-03 MED ORDER — LIDOCAINE 2% (20 MG/ML) 5 ML SYRINGE
INTRAMUSCULAR | Status: DC | PRN
  Administered 2021-11-03: 100 mg via INTRAVENOUS

## 2021-11-03 MED ORDER — SOD CITRATE-CITRIC ACID 500-334 MG/5ML PO SOLN
ORAL | Status: AC
  Administered 2021-11-03: 30 mL via ORAL
  Filled 2021-11-03: qty 30

## 2021-11-03 MED ORDER — 0.9 % SODIUM CHLORIDE (POUR BTL) OPTIME
TOPICAL | Status: DC | PRN
  Administered 2021-11-03: 1000 mL

## 2021-11-03 MED ORDER — ONDANSETRON HCL 4 MG/2ML IJ SOLN: 4.0000 mg | Freq: Once | INTRAMUSCULAR | Status: DC | PRN

## 2021-11-03 MED ORDER — ORAL CARE MOUTH RINSE: 15.0000 mL | Freq: Once | OROMUCOSAL | Status: AC

## 2021-11-03 MED ORDER — ACETAMINOPHEN 325 MG PO TABS: 325.0000 mg | ORAL_TABLET | ORAL | Status: DC | PRN

## 2021-11-03 MED ORDER — KETOROLAC TROMETHAMINE 30 MG/ML IJ SOLN
INTRAMUSCULAR | Status: DC | PRN
  Administered 2021-11-03: 30 mg via INTRAVENOUS

## 2021-11-03 MED ORDER — CHLORHEXIDINE GLUCONATE 0.12 % MT SOLN
OROMUCOSAL | Status: AC
  Administered 2021-11-03: 15 mL via OROMUCOSAL
  Filled 2021-11-03: qty 15

## 2021-11-03 MED ORDER — OXYCODONE HCL 5 MG/5ML PO SOLN: 5.0000 mg | Freq: Once | ORAL | Status: DC | PRN

## 2021-11-03 MED ORDER — IBUPROFEN 800 MG PO TABS: 800.0000 mg | ORAL_TABLET | Freq: Three times a day (TID) | ORAL | 0 refills | Status: AC | PRN

## 2021-11-03 MED ORDER — DEXAMETHASONE SODIUM PHOSPHATE 10 MG/ML IJ SOLN
INTRAMUSCULAR | Status: DC | PRN
  Administered 2021-11-03: 10 mg via INTRAVENOUS

## 2021-11-03 MED ORDER — ACETAMINOPHEN 500 MG PO TABS: 1000.0000 mg | ORAL_TABLET | ORAL | Status: AC

## 2021-11-03 MED ORDER — KETOROLAC TROMETHAMINE 15 MG/ML IJ SOLN: 15.0000 mg | INTRAMUSCULAR | Status: AC

## 2021-11-03 MED ORDER — ONDANSETRON HCL 4 MG/2ML IJ SOLN
INTRAMUSCULAR | Status: AC
  Filled 2021-11-03: qty 2

## 2021-11-03 MED ORDER — ACETAMINOPHEN 160 MG/5ML PO SOLN: 325.0000 mg | ORAL | Status: DC | PRN

## 2021-11-03 MED ORDER — KETOROLAC TROMETHAMINE 15 MG/ML IJ SOLN
INTRAMUSCULAR | Status: AC
  Administered 2021-11-03: 15 mg via INTRAVENOUS
  Filled 2021-11-03: qty 1

## 2021-11-03 MED ORDER — MEPERIDINE HCL 25 MG/ML IJ SOLN: 6.2500 mg | INTRAMUSCULAR | Status: DC | PRN

## 2021-11-03 MED ORDER — MIDAZOLAM HCL 2 MG/2ML IJ SOLN
INTRAMUSCULAR | Status: AC
  Filled 2021-11-03: qty 2

## 2021-11-03 MED ORDER — FENTANYL CITRATE (PF) 250 MCG/5ML IJ SOLN
INTRAMUSCULAR | Status: AC
  Filled 2021-11-03: qty 5

## 2021-11-03 MED ORDER — DEXAMETHASONE SODIUM PHOSPHATE 10 MG/ML IJ SOLN
INTRAMUSCULAR | Status: AC
  Filled 2021-11-03: qty 1

## 2021-11-03 MED ORDER — PROPOFOL 10 MG/ML IV BOLUS
INTRAVENOUS | Status: AC
  Filled 2021-11-03: qty 20

## 2021-11-03 MED ORDER — FENTANYL CITRATE (PF) 100 MCG/2ML IJ SOLN: 25.0000 ug | INTRAMUSCULAR | Status: DC | PRN

## 2021-11-03 MED ORDER — FERRIC SUBSULFATE 259 MG/GM EX SOLN
CUTANEOUS | Status: DC | PRN
Start: 2021-11-03 — End: 2021-11-03
  Administered 2021-11-03: 1 via TOPICAL

## 2021-11-03 MED ORDER — KETOROLAC TROMETHAMINE 30 MG/ML IJ SOLN
INTRAMUSCULAR | Status: AC
  Filled 2021-11-03: qty 1

## 2021-11-03 SURGICAL SUPPLY — 15 items
CATH ROBINSON RED A/P 16FR (CATHETERS) ×3 IMPLANT
ELECT REM PT RETURN 9FT ADLT (ELECTROSURGICAL)
ELECTRODE REM PT RTRN 9FT ADLT (ELECTROSURGICAL) IMPLANT
GLOVE BIO SURGEON STRL SZ7.5 (GLOVE) ×3 IMPLANT
GLOVE SURG UNDER POLY LF SZ7 (GLOVE) ×3 IMPLANT
GOWN STRL REUS W/ TWL LRG LVL3 (GOWN DISPOSABLE) ×2 IMPLANT
GOWN STRL REUS W/ TWL XL LVL3 (GOWN DISPOSABLE) ×2 IMPLANT
GOWN STRL REUS W/TWL LRG LVL3 (GOWN DISPOSABLE) ×1
GOWN STRL REUS W/TWL XL LVL3 (GOWN DISPOSABLE) ×1
KIT PROCEDURE FLUENT (KITS) ×1 IMPLANT
PACK VAGINAL MINOR WOMEN LF (CUSTOM PROCEDURE TRAY) ×3 IMPLANT
PAD OB MATERNITY 4.3X12.25 (PERSONAL CARE ITEMS) ×3 IMPLANT
SCOPETTES 8  STERILE (MISCELLANEOUS) ×1
SCOPETTES 8 STERILE (MISCELLANEOUS) ×1 IMPLANT
TOWEL GREEN STERILE FF (TOWEL DISPOSABLE) ×6 IMPLANT

## 2021-11-03 NOTE — Anesthesia Procedure Notes (Signed)
Procedure Name: LMA Insertion ?Date/Time: 11/03/2021 2:56 PM ?Performed by: Lance Coon, CRNA ?Pre-anesthesia Checklist: Patient identified, Emergency Drugs available, Suction available, Patient being monitored and Timeout performed ?Patient Re-evaluated:Patient Re-evaluated prior to induction ?Oxygen Delivery Method: Circle system utilized ?Preoxygenation: Pre-oxygenation with 100% oxygen ?Induction Type: IV induction ?LMA: LMA inserted ?LMA Size: 3.0 ?Number of attempts: 1 ?Placement Confirmation: positive ETCO2 and breath sounds checked- equal and bilateral ?Tube secured with: Tape ?Dental Injury: Teeth and Oropharynx as per pre-operative assessment  ? ? ? ? ?

## 2021-11-03 NOTE — Anesthesia Preprocedure Evaluation (Signed)
Anesthesia Evaluation  ?Patient identified by MRN, date of birth, ID band ?Patient awake ? ? ? ?Reviewed: ?Allergy & Precautions, H&P , NPO status , Patient's Chart, lab work & pertinent test results, reviewed documented beta blocker date and time  ? ?Airway ?Mallampati: II ? ?TM Distance: >3 FB ?Neck ROM: full ? ? ? Dental ?no notable dental hx. ? ?  ?Pulmonary ?neg pulmonary ROS, Current Smoker,  ?  ?Pulmonary exam normal ?breath sounds clear to auscultation ? ? ? ? ? ? Cardiovascular ?Exercise Tolerance: Good ?negative cardio ROS ? ? ?Rhythm:regular Rate:Normal ? ? ?  ?Neuro/Psych ?Anxiety negative neurological ROS ? negative psych ROS  ? GI/Hepatic ?negative GI ROS, Neg liver ROS,   ?Endo/Other  ?negative endocrine ROS ? Renal/GU ?negative Renal ROS  ?negative genitourinary ?  ?Musculoskeletal ? ? Abdominal ?  ?Peds ? Hematology ?negative hematology ROS ?(+) Blood dyscrasia, anemia ,   ?Anesthesia Other Findings ? ? Reproductive/Obstetrics ?negative OB ROS ? ?  ? ? ? ? ? ? ? ? ? ? ? ? ? ?  ?  ? ? ? ? ? ? ? ? ?Anesthesia Physical ?Anesthesia Plan ? ?ASA: 2 ? ?Anesthesia Plan: General  ? ?Post-op Pain Management: Minimal or no pain anticipated  ? ?Induction: Intravenous ? ?PONV Risk Score and Plan: 3 and Ondansetron, Dexamethasone and Treatment may vary due to age or medical condition ? ?Airway Management Planned: Oral ETT and LMA ? ?Additional Equipment: None ? ?Intra-op Plan:  ? ?Post-operative Plan: Extubation in OR ? ?Informed Consent: I have reviewed the patients History and Physical, chart, labs and discussed the procedure including the risks, benefits and alternatives for the proposed anesthesia with the patient or authorized representative who has indicated his/her understanding and acceptance.  ? ? ? ?Dental Advisory Given ? ?Plan Discussed with: CRNA and Anesthesiologist ? ?Anesthesia Plan Comments: ( ? ?)  ? ? ? ? ? ? ?Anesthesia Quick Evaluation ? ?

## 2021-11-03 NOTE — Op Note (Signed)
Pre OP: Retained IUD and desires for contraception ?Post OP: SAA ?Procedure: Removal of IUD and insertion of [place] IUD ?Surgeon: Arlina Robes ?Anhesthia: LMA ?EBL < 10 cc ? ? ? ? ?Molly Butler is a 36 y.o. [id number] here for Mirena IUD removal and reinsertion.  ?IUD Removal and Reinsertion  ?Patient identified, informed consent performed, consent signed.   Discussed risks of irregular bleeding, cramping, infection, malpositioning or misplacement of the IUD outside the uterus which may require further procedures. Also advised to use backup contraception for one week as the risk of pregnancy is higher during the transition period of removing an IUD and replacing it with another one. Time out was performed. Speculum placed in the vagina. The strings of the IUD were not visualized. A [place] clamp was introduced into the endometrial cavity. The IUD was grasped and grasped and removed in its entirety. The cervix was cleaned with Betadine x 2 and grasped anteriorly with a single tooth tenaculum.  The new Mirena IUD insertion apparatus was used to sound the uterus to 8 cm;  the IUD was then placed per manufacturer's recommendations. Strings trimmed to 3 cm. Tenaculum was removed, good hemostasis noted. Patient tolerated procedure well.  ? ?Patient was given post-procedure instructions.  She was reminded to have backup contraception for one week during this transition period between IUDs.  Patient was also asked to check IUD strings periodically and follow up in 4 weeks for IUD check. ? ?Arlina Robes, MD ?Attending [place] for [facility], [place]  ?

## 2021-11-03 NOTE — Transfer of Care (Signed)
Immediate Anesthesia Transfer of Care Note ? ?Patient: Molly Butler ? ?Procedure(s) Performed: INTRAUTERINE DEVICE (IUD) REMOVAL (Uterus) ? ?Patient Location: PACU ? ?Anesthesia Type:General ? ?Level of Consciousness: drowsy and patient cooperative ? ?Airway & Oxygen Therapy: Patient Spontanous Breathing ? ?Post-op Assessment: Report given to RN and Post -op Vital signs reviewed and stable ? ?Post vital signs: Reviewed and stable ? ?Last Vitals:  ?Vitals Value Taken Time  ?BP 119/79 11/03/21 1523  ?Temp    ?Pulse 77 11/03/21 1523  ?Resp 18 11/03/21 1523  ?SpO2 97 % 11/03/21 1523  ?Vitals shown include unvalidated device data. ? ?Last Pain:  ?Vitals:  ? 11/03/21 1209  ?TempSrc:   ?PainSc: 0-No pain  ?   ? ?  ? ?Complications: No notable events documented. ?

## 2021-11-03 NOTE — Interval H&P Note (Signed)
History and Physical Interval Note: ? ?11/03/2021 ?1:58 PM ? ?Molly Butler  has presented today for surgery, with the diagnosis of RETAINED IUD.  The various methods of treatment have been discussed with the patient and family. After consideration of risks, benefits and other options for treatment, the patient has consented to  Procedure(s): ?INTRAUTERINE DEVICE (IUD) REMOVAL  and reinsertion of IUD as a surgical intervention.  The patient's history has been reviewed, patient examined, no change in status, stable for surgery.  I have reviewed the patient's chart and labs.  Questions were answered to the patient's satisfaction.   ? ? ?Hermina Staggers ? ? ?

## 2021-11-04 ENCOUNTER — Encounter (HOSPITAL_COMMUNITY): Payer: Self-pay | Admitting: Obstetrics and Gynecology

## 2021-11-04 NOTE — Anesthesia Postprocedure Evaluation (Signed)
Anesthesia Post Note ? ?Patient: Molly Butler ? ?Procedure(s) Performed: INTRAUTERINE DEVICE (IUD) REMOVAL (Uterus) ? ?  ? ?Patient location during evaluation: PACU ?Anesthesia Type: General ?Level of consciousness: awake and alert ?Pain management: pain level controlled ?Vital Signs Assessment: post-procedure vital signs reviewed and stable ?Respiratory status: spontaneous breathing, nonlabored ventilation, respiratory function stable and patient connected to nasal cannula oxygen ?Cardiovascular status: blood pressure returned to baseline and stable ?Postop Assessment: no apparent nausea or vomiting ?Anesthetic complications: no ? ? ?No notable events documented. ? ?Last Vitals:  ?Vitals:  ? 11/03/21 1540 11/03/21 1555  ?BP: 126/85 130/86  ?Pulse: 64 63  ?Resp: 17 16  ?Temp:  37.2 ?C  ?SpO2: 100% 98%  ?  ?Last Pain:  ?Vitals:  ? 11/03/21 1555  ?TempSrc:   ?PainSc: 0-No pain  ? ?Pain Goal:   ? ?  ?  ?  ?  ?  ?  ?  ? ?[place] ? ? ? ? ?

## 2021-11-30 ENCOUNTER — Encounter: Payer: Self-pay | Admitting: Obstetrics and Gynecology

## 2021-11-30 ENCOUNTER — Ambulatory Visit (INDEPENDENT_AMBULATORY_CARE_PROVIDER_SITE_OTHER): Payer: 59 | Admitting: Obstetrics and Gynecology

## 2021-11-30 ENCOUNTER — Other Ambulatory Visit (HOSPITAL_COMMUNITY)
Admission: RE | Admit: 2021-11-30 | Discharge: 2021-11-30 | Disposition: A | Discharge status: Home / Self Care | Payer: 59 | Source: Ambulatory Visit | Attending: Obstetrics and Gynecology | Admitting: Obstetrics and Gynecology

## 2021-11-30 DIAGNOSIS — Z975 Presence of (intrauterine) contraceptive device: Secondary | ICD-10-CM | POA: Insufficient documentation

## 2021-11-30 DIAGNOSIS — N898 Other specified noninflammatory disorders of vagina: Secondary | ICD-10-CM

## 2021-11-30 DIAGNOSIS — Z9889 Other specified postprocedural states: Secondary | ICD-10-CM | POA: Insufficient documentation

## 2021-11-30 NOTE — Progress Notes (Signed)
Molly Butler here post op appt. S/P IUD removal and insertion on 11/03/21. Pt has no complaints today Denies any bowel or bladder dysfunction Has been sexual active since IUD insertion without problems  PE AF VSS Lungs clear Heart RRR Abd soft + BS GU NL EGBUS, vaginal discharge noted, swab collected, IUD strings noted   A/P Post OP         IUD check        Vaginal discharge  F/U per test results.

## 2021-11-30 NOTE — Patient Instructions (Signed)

## 2021-12-01 ENCOUNTER — Telehealth: Payer: Self-pay

## 2021-12-01 DIAGNOSIS — B379 Candidiasis, unspecified: Secondary | ICD-10-CM

## 2021-12-01 LAB — CERVICOVAGINAL ANCILLARY ONLY
Candida Glabrata: NEGATIVE
Candida Vaginitis: POSITIVE — AB
Chlamydia: NEGATIVE
Comment: NEGATIVE
Comment: NEGATIVE
Comment: NEGATIVE
Comment: NORMAL
Neisseria Gonorrhea: NEGATIVE

## 2021-12-01 MED ORDER — FLUCONAZOLE 150 MG PO TABS: 150.0000 mg | ORAL_TABLET | Freq: Once | ORAL | 0 refills | Status: AC

## 2021-12-01 NOTE — Telephone Encounter (Addendum)
-----   Message from Chancy Milroy, MD sent at 12/01/2021  3:00 PM EDT ----- Please send in Rx for yeast Pt is aware.  Thanks Legrand Como    Patient notified of result by MyChart by provider. Diflucan 150 mg tablet sent to pharmacy per protocol.
